# Patient Record
Sex: Male | Born: 1950 | Race: White | Hispanic: No | Marital: Married | Smoking: Former smoker
Health system: Southern US, Community
[De-identification: ages and names within clinical notes are randomized; demographics above are authoritative.]

## PROBLEM LIST (undated history)

## (undated) DIAGNOSIS — J9610 Chronic respiratory failure, unspecified whether with hypoxia or hypercapnia: Secondary | ICD-10-CM

## (undated) HISTORY — PX: CARPAL TUNNEL RELEASE: SHX101

---

## 2015-07-20 ENCOUNTER — Encounter (HOSPITAL_COMMUNITY): Payer: Self-pay | Admitting: Internal Medicine

## 2015-07-20 ENCOUNTER — Observation Stay (HOSPITAL_COMMUNITY)
Admission: AD | Admit: 2015-07-20 | Discharge: 2015-07-21 | Disposition: A | Discharge status: Home / Self Care | Payer: BLUE CROSS/BLUE SHIELD | Source: Other Acute Inpatient Hospital | Attending: Internal Medicine | Admitting: Internal Medicine

## 2015-07-20 DIAGNOSIS — E785 Hyperlipidemia, unspecified: Secondary | ICD-10-CM | POA: Diagnosis not present

## 2015-07-20 DIAGNOSIS — Z6832 Body mass index (BMI) 32.0-32.9, adult: Secondary | ICD-10-CM | POA: Insufficient documentation

## 2015-07-20 DIAGNOSIS — J9610 Chronic respiratory failure, unspecified whether with hypoxia or hypercapnia: Secondary | ICD-10-CM | POA: Insufficient documentation

## 2015-07-20 DIAGNOSIS — I639 Cerebral infarction, unspecified: Secondary | ICD-10-CM | POA: Diagnosis present

## 2015-07-20 DIAGNOSIS — J441 Chronic obstructive pulmonary disease with (acute) exacerbation: Secondary | ICD-10-CM | POA: Diagnosis present

## 2015-07-20 DIAGNOSIS — I6339 Cerebral infarction due to thrombosis of other cerebral artery: Secondary | ICD-10-CM | POA: Diagnosis not present

## 2015-07-20 DIAGNOSIS — Z9981 Dependence on supplemental oxygen: Secondary | ICD-10-CM | POA: Diagnosis not present

## 2015-07-20 DIAGNOSIS — Z7951 Long term (current) use of inhaled steroids: Secondary | ICD-10-CM | POA: Diagnosis not present

## 2015-07-20 DIAGNOSIS — F17200 Nicotine dependence, unspecified, uncomplicated: Secondary | ICD-10-CM | POA: Insufficient documentation

## 2015-07-20 DIAGNOSIS — E669 Obesity, unspecified: Secondary | ICD-10-CM | POA: Diagnosis not present

## 2015-07-20 DIAGNOSIS — R200 Anesthesia of skin: Secondary | ICD-10-CM | POA: Diagnosis present

## 2015-07-20 DIAGNOSIS — I10 Essential (primary) hypertension: Secondary | ICD-10-CM | POA: Diagnosis not present

## 2015-07-20 DIAGNOSIS — R270 Ataxia, unspecified: Secondary | ICD-10-CM | POA: Insufficient documentation

## 2015-07-20 DIAGNOSIS — Z79899 Other long term (current) drug therapy: Secondary | ICD-10-CM | POA: Diagnosis not present

## 2015-07-20 HISTORY — DX: Chronic respiratory failure, unspecified whether with hypoxia or hypercapnia: J96.10

## 2015-07-20 MED ORDER — IPRATROPIUM-ALBUTEROL 0.5-2.5 (3) MG/3ML IN SOLN
3.0000 mL | RESPIRATORY_TRACT | Status: DC
  Administered 2015-07-21: 3 mL via RESPIRATORY_TRACT
  Filled 2015-07-20: qty 3

## 2015-07-20 MED ORDER — SODIUM CHLORIDE 0.9 % IV SOLN
INTRAVENOUS | Status: DC
  Administered 2015-07-21: 02:00:00 via INTRAVENOUS

## 2015-07-20 MED ORDER — BUDESONIDE 0.25 MG/2ML IN SUSP
0.2500 mg | Freq: Two times a day (BID) | RESPIRATORY_TRACT | Status: DC
  Filled 2015-07-20: qty 2

## 2015-07-20 MED ORDER — DOXYCYCLINE HYCLATE 100 MG IV SOLR
100.0000 mg | Freq: Two times a day (BID) | INTRAVENOUS | Status: DC
  Administered 2015-07-21 (×2): 100 mg via INTRAVENOUS
  Filled 2015-07-20 (×3): qty 100

## 2015-07-20 MED ORDER — ALBUTEROL SULFATE (2.5 MG/3ML) 0.083% IN NEBU: 2.5000 mg | INHALATION_SOLUTION | RESPIRATORY_TRACT | Status: DC | PRN

## 2015-07-20 MED ORDER — ENOXAPARIN SODIUM 40 MG/0.4ML ~~LOC~~ SOLN
40.0000 mg | SUBCUTANEOUS | Status: DC
  Administered 2015-07-21: 40 mg via SUBCUTANEOUS
  Filled 2015-07-20: qty 0.4

## 2015-07-20 MED ORDER — ASPIRIN 300 MG RE SUPP: 300.0000 mg | Freq: Every day | RECTAL | Status: DC

## 2015-07-20 MED ORDER — ASPIRIN 325 MG PO TABS
325.0000 mg | ORAL_TABLET | Freq: Every day | ORAL | Status: DC
  Administered 2015-07-21: 325 mg via ORAL
  Filled 2015-07-20: qty 1

## 2015-07-20 MED ORDER — STROKE: EARLY STAGES OF RECOVERY BOOK
Freq: Once | Status: AC
  Administered 2015-07-21: 02:00:00
  Filled 2015-07-20: qty 1

## 2015-07-20 NOTE — Progress Notes (Signed)
Patient arrived to unit alert and oriented x 4. States he is in no pain. Patient recently retired, in good spirits and states he ate at [employer] but is getting hungry again. RN [facility]. RN will complete stroke swollow screen. Patient oriented to unit, call light placed within patient reach. RN will continue to monitor patient and review all medications and orders per MD.

## 2015-07-20 NOTE — H&P (Signed)
History and Physical    Vernell Townley OZH:[id number] DOB: [birth year] DOA: 07/20/2015  PCP: Pcp Not In System  Patient coming from: [facility].  Chief Complaint: Right-sided numbness.  HPI: Leonard Johns is a 65 y.o. male with medical history significant of chronic respiratory failure on home oxygen with COPD and tobacco abuse presented to the ER at [facility] with right upper and lower body numbness and right facial numbness. Patient's symptoms started after waking up in the morning at 6:30 and lasted up to 10:30 AM. Patient also has some difficulty walking. Patient has ataxic but did not fall or lose consciousness. Patient's symptoms completely resolved within 30 a.m. MRI brain done at [facility] shows subtle diffusion hyperintensity in the posterior limb left internal capsule presumably recent infarct. Patient was transferred to [facility] for further workup after discussing with the radiologist. On exam patient doesn't is non-focal and is able to walk without difficulty. Denies any headache visual symptoms difficulty speaking swallowing and is able to move all extremities.   ED Course: MRI brain as discussed in the history of present illness. Labs done in the ER at [facility] shows leukocytosis and EKG was showing normal sinus rhythm. CT head was unremarkable except for right-sided maxillary sinusitis. Chest x-ray was unremarkable.  Review of Systems: As per HPI otherwise 10 point review of systems negative.    Past Medical History  Diagnosis Date  . Chronic respiratory failure [facility])     Past Surgical History  Procedure Laterality Date  . Carpal tunnel release       reports that he has quit smoking. He does not have any smokeless tobacco history on file. He reports that he does not drink alcohol or use illicit drugs.  No Known Allergies  Family History  Problem Relation Age of Onset  . CAD Mother   . Stroke Brother     Prior to  Admission medications   Not on File    Physical Exam: Filed Vitals:   07/20/15 2111  BP: 147/79  Pulse: 64  Temp: 98.3 F (36.8 C)  TempSrc: Oral  Resp: 18  Height:  (1.575 m)  Weight: 177 lb 11.2 oz (80.604 kg)  SpO2: 95%      Constitutional: Not in distress. Filed Vitals:   07/20/15 2111  BP: 147/79  Pulse: 64  Temp: 98.3 F (36.8 C)  TempSrc: Oral  Resp: 18  Height:  (1.575 m)  Weight: 177 lb 11.2 oz (80.604 kg)  SpO2: 95%   Eyes: Anicteric no pallor. ENMT: No discharge from the ears eyes nose or mouth. Neck: No mass felt. No neck rigidity. Respiratory: Bilateral expiratory wheeze heard no crepitations. Cardiovascular: S1 and S2 heard. Abdomen: Soft nontender bowel sounds present. Musculoskeletal: No edema. Skin: No rash. Neurologic: Alert awake oriented to time place and person. Moves all extremities 5 x 5. No facial asymmetry. Tongue is midline. PERRLA positive. Psychiatric: Appears normal.   Labs on Admission: I have personally reviewed following labs and imaging studies  CBC: No results for input(s): WBC, NEUTROABS, HGB, HCT, MCV, PLT in the last 168 hours. Basic Metabolic Panel: No results for input(s): NA, K, CL, CO2, GLUCOSE, BUN, CREATININE, CALCIUM, MG, PHOS in the last 168 hours. GFR: CrCl cannot be calculated (Patient has no serum creatinine result on file.). Liver Function Tests: No results for input(s): AST, ALT, ALKPHOS, BILITOT, PROT, ALBUMIN in the last 168 hours. No results for input(s): LIPASE, AMYLASE in the last  168 hours. No results for input(s): AMMONIA in the last 168 hours. Coagulation Profile: No results for input(s): INR, PROTIME in the last 168 hours. Cardiac Enzymes: No results for input(s): CKTOTAL, CKMB, CKMBINDEX, TROPONINI in the last 168 hours. BNP (last 3 results) No results for input(s): PROBNP in the last 8760 hours. HbA1C: No results for input(s): HGBA1C in the last 72 hours. CBG: No results for  input(s): GLUCAP in the last 168 hours. Lipid Profile: No results for input(s): CHOL, HDL, LDLCALC, TRIG, CHOLHDL, LDLDIRECT in the last 72 hours. Thyroid Function Tests: No results for input(s): TSH, T4TOTAL, FREET4, T3FREE, THYROIDAB in the last 72 hours. Anemia Panel: No results for input(s): VITAMINB12, FOLATE, FERRITIN, TIBC, IRON, RETICCTPCT in the last 72 hours. Urine analysis: No results found for: COLORURINE, APPEARANCEUR, LABSPEC, PHURINE, GLUCOSEU, HGBUR, BILIRUBINUR, KETONESUR, PROTEINUR, UROBILINOGEN, NITRITE, LEUKOCYTESUR Sepsis Labs: @LABRCNTIP (procalcitonin:4,lacticidven:4) )No results found for this or any previous visit (from the past 240 hour(s)).   Radiological Exams on Admission: No results found.  EKG: Independently reviewed. Normal sinus rhythm. [facility].  Assessment/Plan Principal Problem:   Stroke, acute, thrombotic (HCC) Active Problems:   COPD exacerbation (HCC)   Stroke (cerebrum) (HCC)    #1. Stroke - MRI was showing features concerning in the left internal capsule. I have discussed with on-call neurologist Dr. Zigmund DanielShikman who will be seeing patient in consult. MRA brain 2-D echo carotid Doppler has been ordered. Closely monitor in telemetry for any arrhythmias. Physical therapy consult. Swallow evaluation and neuro checks. Check hemoglobin A1c and lipid panel. Aspirin. #2. COPD exacerbation - patient has bilateral expiratory wheeze with cough and CT scan also showing sinusitis. I have placed patient on doxycycline with nebulizer and Pulmicort. If patient continues to wheeze may need steroids. #3. Tobacco abuse - patient states he quit last week.   DVT prophylaxis: Lovenox. Code Status: Full code.  Family Communication: Wife and daughter.  Disposition Plan: Home.  Consults called: Neurology.  Admission status: Observation. Telemetry.    Eduard ClosKAKRAKANDY,ARSHAD N. MD [facility] Pager [phone].  If 7PM-7AM, please contact  night-coverage [email or web address] Password [email or web address]  07/20/2015, 11:15 PM

## 2015-07-21 ENCOUNTER — Observation Stay (HOSPITAL_BASED_OUTPATIENT_CLINIC_OR_DEPARTMENT_OTHER): Payer: BLUE CROSS/BLUE SHIELD

## 2015-07-21 ENCOUNTER — Observation Stay (HOSPITAL_COMMUNITY): Payer: BLUE CROSS/BLUE SHIELD

## 2015-07-21 DIAGNOSIS — I63312 Cerebral infarction due to thrombosis of left middle cerebral artery: Secondary | ICD-10-CM

## 2015-07-21 DIAGNOSIS — I639 Cerebral infarction, unspecified: Secondary | ICD-10-CM | POA: Diagnosis not present

## 2015-07-21 DIAGNOSIS — I6330 Cerebral infarction due to thrombosis of unspecified cerebral artery: Secondary | ICD-10-CM | POA: Diagnosis not present

## 2015-07-21 DIAGNOSIS — E785 Hyperlipidemia, unspecified: Secondary | ICD-10-CM

## 2015-07-21 DIAGNOSIS — I6789 Other cerebrovascular disease: Secondary | ICD-10-CM | POA: Diagnosis not present

## 2015-07-21 DIAGNOSIS — J441 Chronic obstructive pulmonary disease with (acute) exacerbation: Secondary | ICD-10-CM | POA: Diagnosis not present

## 2015-07-21 DIAGNOSIS — I6339 Cerebral infarction due to thrombosis of other cerebral artery: Secondary | ICD-10-CM | POA: Diagnosis not present

## 2015-07-21 LAB — COMPREHENSIVE METABOLIC PANEL
ALBUMIN: 3.3 g/dL — AB (ref 3.5–5.0)
ALK PHOS: 69 U/L (ref 38–126)
ALT: 11 U/L — ABNORMAL LOW (ref 17–63)
ANION GAP: 10 (ref 5–15)
AST: 21 U/L (ref 15–41)
BUN: 19 mg/dL (ref 6–20)
CALCIUM: 9 mg/dL (ref 8.9–10.3)
CO2: 24 mmol/L (ref 22–32)
CREATININE: 0.98 mg/dL (ref 0.61–1.24)
Chloride: 106 mmol/L (ref 101–111)
GFR calc non Af Amer: >60 mL/min (ref >60)
Glucose, Bld: 162 mg/dL — ABNORMAL HIGH (ref 65–99)
POTASSIUM: 3.9 mmol/L (ref 3.5–5.1)
SODIUM: 140 mmol/L (ref 135–145)
Total Bilirubin: 0.3 mg/dL (ref 0.3–1.2)
Total Protein: 6.4 g/dL — ABNORMAL LOW (ref 6.5–8.1)

## 2015-07-21 LAB — LIPID PANEL
CHOL/HDL RATIO: 4.7 ratio
CHOLESTEROL: 174 mg/dL (ref 0–200)
HDL: 37 mg/dL — AB (ref >40)
LDL Cholesterol: 124 mg/dL — ABNORMAL HIGH (ref 0–99)
TRIGLYCERIDES: 66 mg/dL (ref <150)
VLDL: 13 mg/dL (ref 0–40)

## 2015-07-21 LAB — CBC
HCT: 43.2 % (ref 39.0–52.0)
Hemoglobin: 14.2 g/dL (ref 13.0–17.0)
MCH: 29.2 pg (ref 26.0–34.0)
MCHC: 32.9 g/dL (ref 30.0–36.0)
MCV: 88.7 fL (ref 78.0–100.0)
PLATELETS: 277 10*3/uL (ref 150–400)
RBC: 4.87 MIL/uL (ref 4.22–5.81)
RDW: 13.7 % (ref 11.5–15.5)
WBC: 10.6 10*3/uL — ABNORMAL HIGH (ref 4.0–10.5)

## 2015-07-21 LAB — ECHOCARDIOGRAM COMPLETE
HEIGHTINCHES: 62 in
WEIGHTICAEL: 2843.2 [oz_av]

## 2015-07-21 LAB — URINALYSIS, ROUTINE W REFLEX MICROSCOPIC
GLUCOSE, UA: NEGATIVE mg/dL
Hgb urine dipstick: NEGATIVE
Ketones, ur: 15 mg/dL — AB
LEUKOCYTES UA: NEGATIVE
NITRITE: NEGATIVE
PROTEIN: NEGATIVE mg/dL
Specific Gravity, Urine: 1.034 — ABNORMAL HIGH (ref 1.005–1.030)
pH: 5.5 (ref 5.0–8.0)

## 2015-07-21 LAB — RAPID URINE DRUG SCREEN, HOSP PERFORMED
Amphetamines: NOT DETECTED
BARBITURATES: NOT DETECTED
Benzodiazepines: NOT DETECTED
COCAINE: NOT DETECTED
Opiates: NOT DETECTED
Tetrahydrocannabinol: NOT DETECTED

## 2015-07-21 MED ORDER — ASPIRIN 81 MG PO TBEC: 81.0000 mg | DELAYED_RELEASE_TABLET | Freq: Every day | ORAL | Status: AC

## 2015-07-21 MED ORDER — BUPROPION HCL ER (SR) 150 MG PO TB12: ORAL_TABLET | ORAL | Status: AC

## 2015-07-21 MED ORDER — PREDNISONE 10 MG (21) PO TBPK: 10.0000 mg | ORAL_TABLET | Freq: Every day | ORAL | Status: AC

## 2015-07-21 MED ORDER — ATORVASTATIN CALCIUM 40 MG PO TABS
40.0000 mg | ORAL_TABLET | Freq: Every day | ORAL | Status: DC
  Filled 2015-07-21: qty 1

## 2015-07-21 MED ORDER — DOXYCYCLINE HYCLATE 50 MG PO CAPS: 50.0000 mg | ORAL_CAPSULE | Freq: Two times a day (BID) | ORAL | Status: AC

## 2015-07-21 MED ORDER — ASPIRIN EC 81 MG PO TBEC
81.0000 mg | DELAYED_RELEASE_TABLET | Freq: Every day | ORAL | Status: DC
  Filled 2015-07-21: qty 1

## 2015-07-21 MED ORDER — ATORVASTATIN CALCIUM 40 MG PO TABS: 40.0000 mg | ORAL_TABLET | Freq: Every day | ORAL | Status: AC

## 2015-07-21 MED ORDER — IPRATROPIUM-ALBUTEROL 0.5-2.5 (3) MG/3ML IN SOLN
3.0000 mL | Freq: Three times a day (TID) | RESPIRATORY_TRACT | Status: DC
  Filled 2015-07-21: qty 3

## 2015-07-21 NOTE — Progress Notes (Signed)
*  PRELIMINARY RESULTS* Vascular Ultrasound Carotid Duplex (Doppler) has been completed. There is no obvious evidence of hemodynamically significant internal carotid artery stenosis bilaterally. Vertebral arteries are patent with antegrade flow.  07/21/2015 2:56 PM Gertie FeyMichelle Nickolette Espinola, RVT, RDCS, RDMS

## 2015-07-21 NOTE — Progress Notes (Signed)
Pt discharge education and instructions completed with pt and family at bedside; all voices understanding denies any questions. Pt IV and telemetry removed; pt discharge home with family to transport him home. Pt to pick up electronically sent prescription from preferred pharmacy on file. Pt offered wheelchair but he declined and ambulated off unit with belongings and family at side. Dionne BucyP. Amo Shalynn Jorstad RN

## 2015-07-21 NOTE — Progress Notes (Addendum)
STROKE TEAM PROGRESS NOTE   HISTORY OF PRESENT ILLNESS Leonard Johns is an 65 y.o. male with hx of COPD and smoking [facility] with right upper and lower body numbness and right facial numbness. Patient's symptoms started after waking up in the morning lasted 4 hours till 10:30 AM. Patient's symptoms completely resolved. [facility] shows diffusion hyperintensity in the posterior limb left internal capsule presumably acute vs subacute infarct. Patient was [facility]. Patient was not administered IV t-PA.    SUBJECTIVE (INTERVAL HISTORY) His wife is at the bedside.  Overall he feels his condition is completely resolved. He was retired not long ago, decreased physical activity levels and more eating at home. Still smoking. Stated that his annual check with PCP always good at cholesterol.    OBJECTIVE Temp:  [98.3 F (36.8 C)-98.4 F (36.9 C)] 98.4 F (36.9 C) (05/19 0000) Pulse Rate:  [64-79] 73 (05/19 0707) Cardiac Rhythm:  [-] Normal sinus rhythm (05/19 0700) Resp:  [16-20] 20 (05/19 0707) BP: (108-147)/(45-79) 142/70 mmHg (05/19 0707) SpO2:  [92 %-95 %] 92 % (05/19 0707) Weight:  [80.604 kg (177 lb 11.2 oz)] 80.604 kg (177 lb 11.2 oz) (05/18 2111)  CBC:  Recent Labs Lab 07/21/15 0008  WBC 10.6*  HGB 14.2  HCT 43.2  MCV 88.7  PLT 277    Basic Metabolic Panel:  Recent Labs Lab 07/21/15 0008  NA 140  K 3.9  CL 106  CO2 24  GLUCOSE 162*  BUN 19  CREATININE 0.98  CALCIUM 9.0    Lipid Panel:    Component Value Date/Time   CHOL 174 07/21/2015 0008   TRIG 66 07/21/2015 0008   HDL 37* 07/21/2015 0008   CHOLHDL 4.7 07/21/2015 0008   VLDL 13 07/21/2015 0008   LDLCALC 124* 07/21/2015 0008   HgbA1c: No results found for: HGBA1C Urine Drug Screen:    Component Value Date/Time   LABOPIA NONE DETECTED 07/21/2015 0500   COCAINSCRNUR NONE DETECTED 07/21/2015 0500   LABBENZ  NONE DETECTED 07/21/2015 0500   AMPHETMU NONE DETECTED 07/21/2015 0500   THCU NONE DETECTED 07/21/2015 0500   LABBARB NONE DETECTED 07/21/2015 0500      IMAGING I have personally reviewed the radiological images below and agree with the radiology interpretations.  MRI brain (@ [facility]) - no imaging available. 07/30/2015 diffusion hyperintensity in the posterior limb left internal capsule, acute vs subacute infarct  Mr Maxine GlennMra Head/brain Wo Cm 07/21/2015   Normal intracranial MRA. No large or proximal arterial branch occlusion. No high-grade or correctable stenosis. Widely patent vertebrobasilar system with minimal atheromatous disease for patient age.   CUS - There is no obvious evidence of hemodynamically significant internal carotid artery stenosis bilaterally. Vertebral arteries are patent with antegrade flow.  2D echo -  - Left ventricle: The cavity size was normal. Wall thickness was  normal. Systolic function was normal. The estimated ejection  fraction was in the range of 55% to 60%. Wall motion was normal;  there were no regional wall motion abnormalities. Doppler  parameters are consistent with abnormal left ventricular  relaxation (grade 1 diastolic dysfunction). - Left atrium: The atrium was mildly dilated. Impressions: - Normal LV systolic function; grade 1 diastolic dysfunction; mild  LAE.  PHYSICAL EXAM Physical exam  Temp:  [98.3 F (36.8 C)-98.4 F (36.9 C)] 98.4 F (36.9 C) (05/19 0000) Pulse Rate:  [64-79] 67 (05/19 0920) Resp:  [  16-20] 20 (05/19 0920) BP: (108-147)/(45-79) 135/67 mmHg (05/19 0920) SpO2:  [92 %-95 %] 95 % (05/19 0920) Weight:  [177 lb 11.2 oz (80.604 kg)] 177 lb 11.2 oz (80.604 kg) (05/18 2111)  General - Well nourished, well developed, in no apparent distress.  Ophthalmologic - Sharp disc margins OU.   Cardiovascular - Regular rate and rhythm with no murmur.  Mental Status -  Level of arousal and orientation to time, place, and  person were intact. Language including expression, naming, repetition, comprehension was assessed and found intact. Fund of Knowledge was assessed and was intact.  Cranial Nerves II - XII - II - Visual field intact OU. III, IV, VI - Extraocular movements intact. V - Facial sensation intact bilaterally. VII - Facial movement intact bilaterally. VIII - Hearing & vestibular intact bilaterally. X - Palate elevates symmetrically. XI - Chin turning & shoulder shrug intact bilaterally. XII - Tongue protrusion intact.  Motor Strength - The patient's strength was normal in all extremities and pronator drift was absent.  Bulk was normal and fasciculations were absent.   Motor Tone - Muscle tone was assessed at the neck and appendages and was normal.  Reflexes - The patient's reflexes were 1+ in all extremities and he had no pathological reflexes.  Sensory - Light touch, temperature/pinprick were assessed and were symmetrical.    Coordination - The patient had normal movements in the hands and feet with no ataxia or dysmetria.  Tremor was absent.  Gait and Station - The patient's transfers, posture, gait, station, and turns were observed as normal.   ASSESSMENT/PLAN Mr. Leonard Johns is a 65 y.o. male with history of COPD and smoking who presented to [facility] with transient R sided face/arm/leg numbness. Transferred to [facility] for further workup. He did not receive IV t-PA.   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 OK with ASA  daily on discharge.  Patient counseled to be compliant with his antithrombotic medications  Ongoing aggressive stroke  risk factor management  Therapy recommendations:  No PT  Disposition:  Return home  Hypertension  Stable  Permissive hypertension (OK if < 220/120) but gradually normalize in 5-7 days  Hyperlipidemia  Home meds:  No statin  LDL 124, goal < 70  Add lipitor   Continue statin at discharge  Tobacco abuse  Current smoker  Smoking cessation counseling provided  Pt is willing to quit  Other Stroke Risk Factors  Obesity, Body mass index is 32.49 kg/(m^2).   Family hx stroke (brother)  Other Active Problems  COPD exacerbation  Hospital day # 1  Neurology will sign off. Please call with questions. Pt will follow up with Darrol Angel NP at [facility] in about 2 months. Thanks for the consult.  Marvel Plan, MD PhD Stroke Neurology 07/21/2015 12:48 PM    To contact Stroke Continuity provider, please refer to [email or web address]. After hours, contact General Neurology

## 2015-07-21 NOTE — Evaluation (Signed)
Physical Therapy Evaluation and Discharge  Patient Details Name: Leonard Johns MRN: [id number] DOB: May 19, 1950 Today's Date: 07/21/2015   History of Present Illness  65 y.o. male with hx of COPD and smoking presented to the ER at [facility] with right upper and lower body numbness and right facial numbness. Patient's symptoms started after waking up in the morning lasted 4 hours till 10:30 AM. Patient's symptoms completely resolved. MRI brain done at [facility] shows diffusion hyperintensity in the posterior limb left internal capsule presumably acute vs subacute infarct    Clinical Impression  Patient evaluated by Physical Therapy with no further acute PT needs identified. Patient had Lt knee injury ~4 months ago (could only say he heard a "pop"; tests negative for ligament injury; used crutches (now intermittently uses cane); and had OPPT. He continues to have mild gait disturbance due to Lt knee pain).  PT is signing off. Thank you for this referral.     Follow Up Recommendations No PT follow up    Equipment Recommendations  None recommended by PT    Recommendations for Other Services       Precautions / Restrictions Precautions Precautions: Fall Precaution Comments: fall risk due to Lt knee injury ~4 mos ago      Mobility  Bed Mobility Overal bed mobility: Modified Independent                Transfers Overall transfer level: Independent Equipment used: None                Ambulation/Gait Ambulation/Gait assistance: Min Control and instrumentation engineer (Feet): 300 Feet Assistive device: Straight cane;None Gait Pattern/deviations: Step-through pattern;Antalgic;Decreased stride length Gait velocity: able to incr and decr, however overall decr Gait velocity interpretation: Below normal speed for age/gender General Gait Details: without device pt with partial buckle of Lt knee due to sudden pain; pt independently caught himself and  pain did not occur again; worked with cane and pt required instruction re: proper use and recommended he use it for now;  Stairs Stairs: Yes Stairs assistance: Modified independent (Device/Increase time) Stair Management: One rail Right;Alternating pattern;Forwards Number of Stairs: 5 General stair comments: no difficulties (including Lt knee)  Wheelchair Mobility    Modified Rankin (Stroke Patients Only) Modified Rankin (Stroke Patients Only) Pre-Morbid Rankin Score: No symptoms Modified Rankin: No symptoms (due to CVA; mild impairment due to recent knee injury)     Balance Overall balance assessment: Independent (denies falling since knee injury)                               Standardized Balance Assessment Standardized Balance Assessment : Dynamic Gait Index;Berg Balance Test Berg Balance Test Sit to Stand: Able to stand without using hands and stabilize independently Standing Unsupported: Able to stand safely 2 minutes Sitting with Back Unsupported but Feet Supported on Floor or Stool: Able to sit safely and securely 2 minutes Stand to Sit: Sits safely with minimal use of hands Transfers: Able to transfer safely, minor use of hands Standing Unsupported with Eyes Closed: Able to stand 10 seconds safely Standing Ubsupported with Feet Together: Able to place feet together independently and stand 1 minute safely Standing Unsupported, Alternately Place Feet on Step/Stool: Able to stand independently and safely and complete 8 steps in 20 seconds Standing Unsupported, One Foot in Front: Able to place foot tandem independently and hold 30 seconds Standing on One Leg: Able to lift leg independently  and hold > 10 seconds Dynamic Gait Index Level Surface: Mild Impairment Change in Gait Speed: Mild Impairment Gait with Horizontal Head Turns: Mild Impairment Gait with Vertical Head Turns: Mild Impairment Gait and Pivot Turn: Normal Step Over Obstacle: Mild  Impairment Step Around Obstacles: Normal Steps: Normal Total Score: 19       Pertinent Vitals/Pain Pain Assessment: No/denies pain    Home Living Family/patient expects to be discharged to:: Private residence Living Arrangements: Spouse/significant other Available Help at Discharge: Family;Available PRN/intermittently Type of Home: House Home Access: Stairs to enter Entrance Stairs-Rails: None Entrance Stairs-Number of Steps: 1 Home Layout: One level Home Equipment: Crutches;Cane - single point      Prior Function Level of Independence: Independent with assistive device(s)         Comments: after knee "popped" he was put on crutches, went to OPPT, progressed to cane which he now rarely uses (mostly if back hurting)     Hand Dominance   Dominant Hand: Left    Extremity/Trunk Assessment   Upper Extremity Assessment: Defer to OT evaluation           Lower Extremity Assessment: Overall WFL for tasks assessed (sensation, proprioception, coordination, strength intact)      Cervical / Trunk Assessment: Normal  Communication   Communication: No difficulties  Cognition Arousal/Alertness: Awake/alert Behavior During Therapy: WFL for tasks assessed/performed Overall Cognitive Status: Within Functional Limits for tasks assessed                      General Comments General comments (skin integrity, edema, etc.): DGI score decreased due to antalgic Lt knee and gait changes    Exercises        Assessment/Plan    PT Assessment Patent does not need any further PT services  PT Diagnosis Hemiplegia non-dominant side   PT Problem List    PT Treatment Interventions     PT Goals (Current goals can be found in the Care Plan section) Acute Rehab PT Goals Patient Stated Goal: go home today PT Goal Formulation: All assessment and education complete, DC therapy    Frequency     Barriers to discharge        Co-evaluation               End of  Session Equipment Utilized During Treatment: Gait belt Activity Tolerance: Patient tolerated treatment well Patient left: in bed;with call bell/phone within reach;with family/visitor present Nurse Communication: Mobility status (OK to be up alone with cane;)    Functional Assessment Tool Used: clinical judgement Functional Limitation: Mobility: Walking and moving around Mobility: Walking and Moving Around Current Status (Z6109): At least 1 percent but less than 20 percent impaired, limited or restricted Mobility: Walking and Moving Around Goal Status [phone]): At least 1 percent but less than 20 percent impaired, limited or restricted Mobility: Walking and Moving Around Discharge Status [phone]): At least 1 percent but less than 20 percent impaired, limited or restricted    Time: 0852-0925 PT Time Calculation (min) (ACUTE ONLY): 33 min   Charges:   PT Evaluation $PT Eval Low Complexity: 1 Procedure PT Treatments $Therapeutic Activity: 8-22 mins   PT G Codes:   PT G-Codes **NOT FOR INPATIENT CLASS** Functional Assessment Tool Used: clinical judgement Functional Limitation: Mobility: Walking and moving around Mobility: Walking and Moving Around Current Status (B1478): At least 1 percent but less than 20 percent impaired, limited or restricted Mobility: Walking and Moving Around Goal Status [phone]): At  least 1 percent but less than 20 percent impaired, limited or restricted Mobility: Walking and Moving Around Discharge Status [phone](G8980): At least 1 percent but less than 20 percent impaired, limited or restricted    Cabela Pacifico 07/21/2015, 9:46 AM Pager [phone]

## 2015-07-21 NOTE — Progress Notes (Signed)
OT Cancellation Note  Patient Details Name: Leonard Johns MRN: [id number] DOB: 10-17-1950   Cancelled Treatment:    Reason Eval/Treat Not Completed: OT screened, no needs identified, will sign off. Pt reports all symptoms have resolved; no visual or UE deficits noted. Per PT; pt at baseline with mobility. Educated pt and family on signs and symptoms of stroke. No further acute OT needs identified; signing off at this time. Please re-consult if needs change. Thank you for this referral.  Gaye AlkenBailey A Neela Zecca M.S., OTR/L Pager: [phone]  07/21/2015, 10:42 AM

## 2015-07-21 NOTE — Discharge Summary (Signed)
Physician Discharge Summary  Leonard Johns ZOX:[id number] DOB: [birth year] DOA: 07/20/2015  PCP: Pcp Not In System  Admit date: 07/20/2015 Discharge date: 07/21/2015   Recommendations for Outpatient Follow-Up:   1. Smoking cessation 2. HgbA1C   Discharge Diagnosis:   Principal Problem:   Stroke, acute, thrombotic (HCC) Active Problems:   COPD exacerbation (HCC)   Stroke (cerebrum) [facility])   Discharge disposition:  Home  Discharge Condition: Improved.  Diet recommendation: Low sodium, heart healthy  Wound care: None.   History of Present Illness:   Leonard Johns is a 65 y.o. male with medical history significant of chronic respiratory failure on home oxygen with COPD and tobacco abuse presented to the ER at [facility] with right upper and lower body numbness and right facial numbness. Patient's symptoms started after waking up in the morning at 6:30 and lasted up to 10:30 AM. Patient also has some difficulty walking. Patient has ataxic but did not fall or lose consciousness. Patient's symptoms completely resolved within 30 a.m. MRI brain done at [facility] shows subtle diffusion hyperintensity in the posterior limb left internal capsule presumably recent infarct. Patient was transferred to [facility] for further workup after discussing with the radiologist. On exam patient doesn't is non-focal and is able to walk without difficulty. Denies any headache visual symptoms difficulty speaking swallowing and is able to move all extremities.    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  Patient counseled to be compliant with his antithrombotic medications  Ongoing  aggressive stroke risk factor management  Therapy recommendations: No PT  Disposition: Return home 5/19  Hypertension  Stable  Permissive hypertension (OK if < 220/120) but gradually normalize in 5-7 days  Hyperlipidemia  Home meds: No statin  LDL 124, goal < 70  Add lipitor   Continue statin at discharge  FLP/LFTs 6 weeks    COPD exacerbation - abx and steroid taper  Tobacco abuse - wellbutrin sent to pharmacy- only medication that has helped him quit before- defer to PCP for outpatient management    Medical Consultants:    None.   Discharge Exam:   Filed Vitals:   07/21/15 0707 07/21/15 0920  BP: 142/70 135/67  Pulse: 73 67  Temp:    Resp: 20 20   Filed Vitals:   07/21/15 0104 07/21/15 0414 07/21/15 0707 07/21/15 0920  BP: 124/64 136/74 142/70 135/67  Pulse: 74 68 73 67  Temp:      TempSrc:      Resp: Height:      Weight:      SpO2: 94% 94% 92% 95%    Gen:  NAD Cardiovascular:  RRR, No M/R/G Respiratory: Lungs CTAB Gastrointestinal: Abdomen soft, NT/ND with normal active bowel sounds. Extremities: No C/E/C   The results of significant diagnostics from this hospitalization (including imaging, microbiology, ancillary and laboratory) are listed below for reference.     Procedures and Diagnostic Studies:   Mr [name] Head/brain Wo Cm  07/21/2015  CLINICAL DATA:  Initial evaluation for acute dizziness. EXAM: MRA HEAD WITHOUT CONTRAST TECHNIQUE: Angiographic images of the Circle of Willis were obtained using MRA technique without intravenous contrast. COMPARISON:  None.  FINDINGS: ANTERIOR CIRCULATION: Visualized distal cervical segments of the internal carotid arteries are widely patent with antegrade flow. Petrous, cavernous, and supraclinoid segments are widely patent bilaterally without flow-limiting stenosis. ICA termini widely patent. A1 segments patent bilaterally. Anterior communicating artery normal. Anterior cerebral  arteries well opacified to their distal aspects and normal in appearance. M1 segments widely patent without stenosis or occlusion. MCA bifurcations normal. Probable short-segment atheromatous stenosis within a proximal M2 branch noted on the left (series 303, image 12). Distal MCA branches well opacified and symmetric. Minimal distal small vessel atheromatous irregularity noted within the MCA branches bilaterally. POSTERIOR CIRCULATION: Vertebral arteries widely patent to the vertebrobasilar junction. Vertebral arteries are largely code dominant. Partially visualized posterior inferior cerebral arteries patent bilaterally. Basilar artery widely patent. Superior cerebellar arteries well opacified bilaterally. Probable short-segment stenosis within the proximal left SCA. Both of the posterior cerebral arteries arise from the basilar artery are well opacified to their distal aspects. Possible tiny short-segment mild stenosis within the proximal left P2 segment. No aneurysm or vascular malformation. IMPRESSION: Normal intracranial MRA. No large or proximal arterial branch occlusion. No high-grade or correctable stenosis. Widely patent vertebrobasilar system with minimal atheromatous disease for patient age. Electronically Signed   By: Rise Mu M.D.   On: 07/21/2015 03:23     Labs:   Basic Metabolic Panel:  Recent Labs Lab 07/21/15 0008  NA 140  K 3.9  CL 106  CO2 24  GLUCOSE 162*  BUN 19  CREATININE 0.98  CALCIUM 9.0   GFR Estimated Creatinine Clearance: 71.9 mL/min (by C-G formula based on Cr of 0.98). Liver Function Tests:  Recent Labs Lab 07/21/15 0008  AST 21  ALT 11*  ALKPHOS 69  BILITOT 0.3  PROT 6.4*  ALBUMIN 3.3*   No results for input(s): LIPASE, AMYLASE in the last 168 hours. No results for input(s): AMMONIA in the last 168 hours. Coagulation profile No results for input(s): INR, PROTIME in the last 168 hours.  CBC:  Recent Labs Lab 07/21/15 0008  WBC  10.6*  HGB 14.2  HCT 43.2  MCV 88.7  PLT 277   Cardiac Enzymes: No results for input(s): CKTOTAL, CKMB, CKMBINDEX, TROPONINI in the last 168 hours. BNP: Invalid input(s): POCBNP CBG: No results for input(s): GLUCAP in the last 168 hours. D-Dimer No results for input(s): DDIMER in the last 72 hours. Hgb A1c No results for input(s): HGBA1C in the last 72 hours. Lipid Profile  Recent Labs  07/21/15 0008  CHOL 174  HDL 37*  LDLCALC 124*  TRIG 66  CHOLHDL 4.7   Thyroid function studies No results for input(s): TSH, T4TOTAL, T3FREE, THYROIDAB in the last 72 hours.  Invalid input(s): FREET3 Anemia work up No results for input(s): VITAMINB12, FOLATE, FERRITIN, TIBC, IRON, RETICCTPCT in the last 72 hours. Microbiology No results found for this or any previous visit (from the past 240 hour(s)).   Discharge Instructions:      Medication List    TAKE these medications        amLODipine 5 MG tablet  Commonly known as:  NORVASC  Take 5 mg by mouth daily.     aspirin 81 MG EC tablet  Take 1 tablet (81 mg total) by mouth daily.     atorvastatin 40 MG tablet  Commonly known as:  LIPITOR  Take 1 tablet (40 mg total) by mouth daily at 6 PM.     BREO ELLIPTA 100-25 MCG/INH Aepb  Generic drug:  fluticasone furoate-vilanterol  Inhale  1 puff into the lungs daily.     buPROPion 150 MG 12 hr tablet  Commonly known as:  WELLBUTRIN SR  150 mg PO daily x 3 days then 2x/day     doxycycline 50 MG capsule  Commonly known as:  VIBRAMYCIN  Take 1 capsule (50 mg total) by mouth 2 (two) times daily.     predniSONE 10 MG (21) Tbpk tablet  Commonly known as:  STERAPRED UNI-PAK 21 TAB  Take 1 tablet (10 mg total) by mouth daily.     PROAIR HFA 108 (90 Base) MCG/ACT inhaler  Generic drug:  albuterol  Inhale 1 puff into the lungs every 6 (six) hours as needed. wheezing          Time coordinating discharge: 35 min  Signed:  Covey Baller U Liel Rudden   [facility] 07/21/2015,  11:54 AM

## 2015-07-21 NOTE — Consult Note (Signed)
NEURO HOSPITALIST CONSULT NOTE      Reason for Consult: acute vs subacute stroke on MRI   History obtained from:  Patient     HPI:                                                                                                                                          Leonard Johns is an 65 y.o. male with hx of COPD and smoking presented [facility] ER [facility] with right upper and lower body numbness and right facial numbness. Patient's symptoms started after waking up in the morning lasted 4 hours till 10:30 AM.  Patient's symptoms completely resolved. [facility] brain done [facility] shows  diffusion hyperintensity in the posterior limb left internal capsule presumably acute vs subacute infarct. [facility]   Past Medical History  Diagnosis Date  . Chronic respiratory failure [facility](HCC)     Past Surgical History  Procedure Laterality Date  . Carpal tunnel release      Family History  Problem Relation Age of Onset  . CAD Mother   . Stroke Brother      Social History:  reports that he has quit smoking. He does not have any smokeless tobacco history on file. He reports that he does not drink alcohol or use illicit drugs.  No Known Allergies  MEDICATIONS:                                                                                                                     I have reviewed the patient's current medications.   ROS:  History obtained from chart review and the patient  General ROS: negative for - chills, fatigue, fever, night sweats, weight gain or weight loss Psychological ROS: negative for - behavioral disorder, hallucinations, memory difficulties, mood swings or suicidal ideation Ophthalmic ROS: negative for - blurry vision, double vision, eye pain  or loss of vision ENT ROS: negative for - epistaxis, nasal discharge, oral lesions, sore throat, tinnitus or vertigo Allergy and Immunology ROS: negative for - hives or itchy/watery eyes Hematological and Lymphatic ROS: negative for - bleeding problems, bruising or swollen lymph nodes Endocrine ROS: negative for - galactorrhea, hair pattern changes, polydipsia/polyuria or temperature intolerance Respiratory ROS: negative for - cough, hemoptysis, shortness of breath or wheezing Cardiovascular ROS: negative for - chest pain, dyspnea on exertion, edema or irregular heartbeat Gastrointestinal ROS: negative for - abdominal pain, diarrhea, hematemesis, nausea/vomiting or stool incontinence Genito-Urinary ROS: negative for - dysuria, hematuria, incontinence or urinary frequency/urgency Musculoskeletal ROS: negative for - joint swelling or muscular weakness Neurological ROS: as noted in HPI Dermatological ROS: negative for rash and skin lesion changes   Blood pressure 136/74, pulse 68, temperature 98.4 F (36.9 C), temperature source Oral, resp. rate 16, height 5\' 2"  (1.575 m), weight 80.604 kg (177 lb 11.2 oz), SpO2 94 %.   Neurologic Examination:                                                                                                      HEENT-  Normocephalic, no lesions, without obvious abnormality.  Normal external eye and conjunctiva.  Normal TM's bilaterally.  Normal auditory canals and external ears. Normal external nose, mucus membranes and septum.  Normal pharynx. Cardiovascular- regular rate and rhythm, S1, S2 normal, no murmur, click, rub or gallop, pulses palpable throughout   Lungs- chest clear, no wheezing, rales, normal symmetric air entry, Heart exam - S1, S2 normal, no murmur, no gallop, rate regular Abdomen- soft, non-tender; bowel sounds normal; no masses,  no organomegaly   Neurological Examination Mental Status: Alert, oriented, thought content appropriate.  Speech  fluent without evidence of aphasia.  Able to follow 3 step commands without difficulty. Cranial Nerves: II:  Visual fields grossly normal, pupils equal, round, reactive to light and accommodation III,IV, VI: ptosis not present, extra-ocular motions intact bilaterally V,VII: smile symmetric, facial light touch sensation normal bilaterally VIII: hearing normal bilaterally IX,X: uvula rises symmetrically XI: bilateral shoulder shrug XII: midline tongue extension Motor: Right : Upper extremity   5/5    Left:     Upper extremity   5/5  Lower extremity   5/5     Lower extremity   5/5 Tone and bulk:normal tone throughout; no atrophy noted Sensory: Pinprick and light touch intact throughout, bilaterally Cerebellar: normal finger-to-nose intact b/l      Lab Results: Basic Metabolic Panel:  Recent Labs Lab 07/21/15 0008  NA 140  K 3.9  CL 106  CO2 24  GLUCOSE 162*  BUN 19  CREATININE 0.98  CALCIUM 9.0    Liver Function Tests:  Recent Labs Lab 07/21/15 0008  AST 21  ALT  11*  ALKPHOS 69  BILITOT 0.3  PROT 6.4*  ALBUMIN 3.3*   No results for input(s): LIPASE, AMYLASE in the last 168 hours. No results for input(s): AMMONIA in the last 168 hours.  CBC:  Recent Labs Lab 07/21/15 0008  WBC 10.6*  HGB 14.2  HCT 43.2  MCV 88.7  PLT 277    Cardiac Enzymes: No results for input(s): CKTOTAL, CKMB, CKMBINDEX, TROPONINI in the last 168 hours.  Lipid Panel:  Recent Labs Lab 07/21/15 0008  CHOL 174  TRIG 66  HDL 37*  CHOLHDL 4.7  VLDL 13  LDLCALC 161*    CBG: No results for input(s): GLUCAP in the last 168 hours.  Microbiology: No results found for this or any previous visit.  Coagulation Studies: No results for input(s): LABPROT, INR in the last 72 hours.  Imaging: Mr [name] Head/brain Wo Cm  07/21/2015  CLINICAL DATA:  Initial evaluation for acute dizziness. EXAM: MRA HEAD WITHOUT CONTRAST TECHNIQUE: Angiographic images of the Circle of Willis were  obtained using MRA technique without intravenous contrast. COMPARISON:  None. FINDINGS: ANTERIOR CIRCULATION: Visualized distal cervical segments of the internal carotid arteries are widely patent with antegrade flow. Petrous, cavernous, and supraclinoid segments are widely patent bilaterally without flow-limiting stenosis. ICA termini widely patent. A1 segments patent bilaterally. Anterior communicating artery normal. Anterior cerebral arteries well opacified to their distal aspects and normal in appearance. M1 segments widely patent without stenosis or occlusion. MCA bifurcations normal. Probable short-segment atheromatous stenosis within a proximal M2 branch noted on the left (series 303, image 12). Distal MCA branches well opacified and symmetric. Minimal distal small vessel atheromatous irregularity noted within the MCA branches bilaterally. POSTERIOR CIRCULATION: Vertebral arteries widely patent to the vertebrobasilar junction. Vertebral arteries are largely code dominant. Partially visualized posterior inferior cerebral arteries patent bilaterally. Basilar artery widely patent. Superior cerebellar arteries well opacified bilaterally. Probable short-segment stenosis within the proximal left SCA. Both of the posterior cerebral arteries arise from the basilar artery are well opacified to their distal aspects. Possible tiny short-segment mild stenosis within the proximal left P2 segment. No aneurysm or vascular malformation. IMPRESSION: Normal intracranial MRA. No large or proximal arterial branch occlusion. No high-grade or correctable stenosis. Widely patent vertebrobasilar system with minimal atheromatous disease for patient age. Electronically Signed   By: Rise Mu M.D.   On: 07/21/2015 03:23        Assessment/Plan:  65 y.o. male with hx of COPD and smoking presented to the ER at [facility] with right upper and lower body numbness and right facial numbness. Patient's symptoms  started after waking up in the morning lasted 4 hours till 10:30 AM.  Patient's symptoms completely resolved. MRI brain done at [facility] shows  diffusion hyperintensity in the posterior limb left internal capsule presumably acute vs subacute infarct. Patient was transferred to [facility] for further workup    1. HgbA1c, fasting lipid panel 2. MRI, MRA  of the brain without contrast already done at [place]  3. PT consult, OT consult, Speech consult 4. Echocardiogram 5. Carotid dopplers 6. Prophylactic therapy-Antiplatelet med: Aspirin - dose  7. Risk factor modification 8. Telemetry monitoring 9. Frequent neuro checks 10 NPO until passes stroke swallow screen 11 please page stroke NP  Or  PA  Or MD from 8am -4 pm  as this patient from this time will be  followed by the stroke.   You can look them up on [email or web address]  Password [email or web address]

## 2015-07-22 LAB — HEMOGLOBIN A1C
Hgb A1c MFr Bld: 6.4 % — ABNORMAL HIGH (ref 4.8–5.6)
Mean Plasma Glucose: 137 mg/dL

## 2015-09-25 ENCOUNTER — Ambulatory Visit: Payer: BLUE CROSS/BLUE SHIELD | Admitting: Nurse Practitioner

## 2016-12-20 IMAGING — MR MR MRA HEAD W/O CM
1 series · 19 of 48 positions shown · non-contrast
Comparison: None.

CLINICAL DATA: Initial evaluation for acute dizziness.

EXAM:
MRA HEAD WITHOUT CONTRAST
TECHNIQUE: Angiographic images of the Circle of Willis were obtained using MRA
technique without intravenous contrast.

[Series 3: (id) mt fs · axial · 1.4mm · 0.43mm/px · z∈[-72,+18]mm · 19 of 136 slices shown]
[im 1/136]
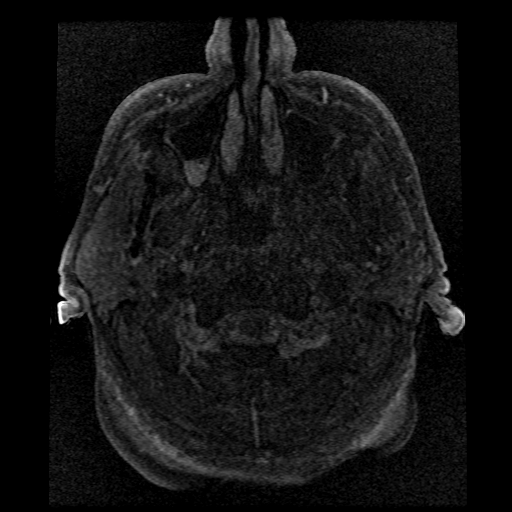
[im 3/136]
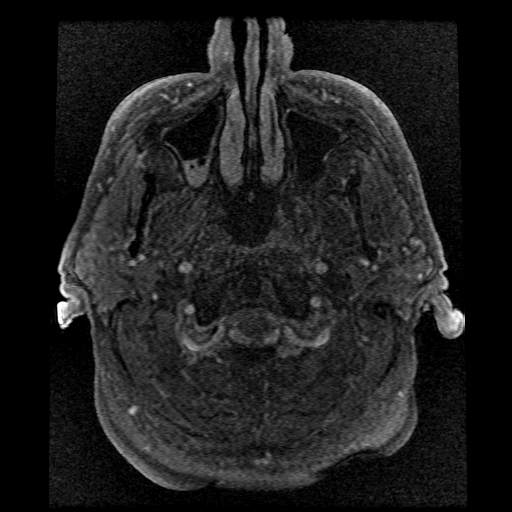
[im 6/136]
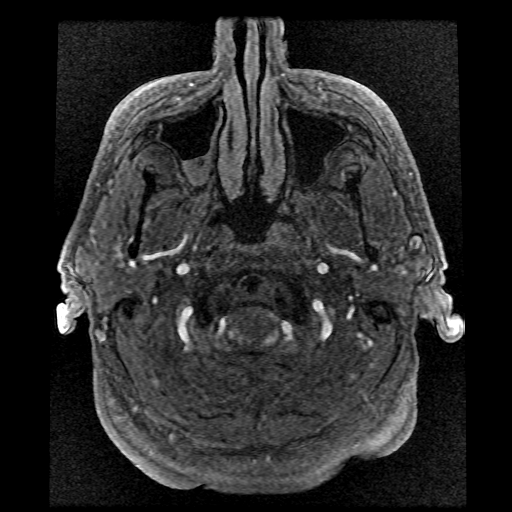
[im 9/136]
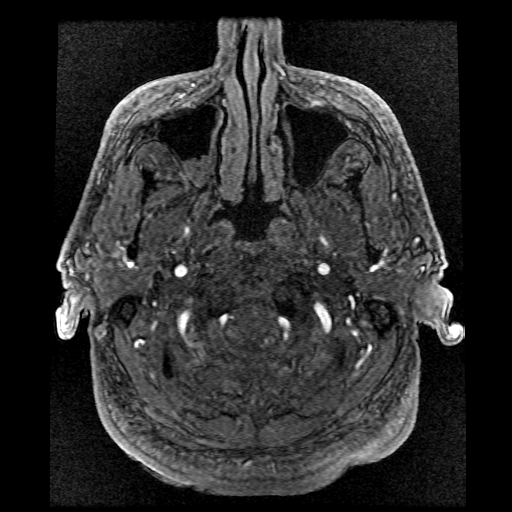
[im 12/136]
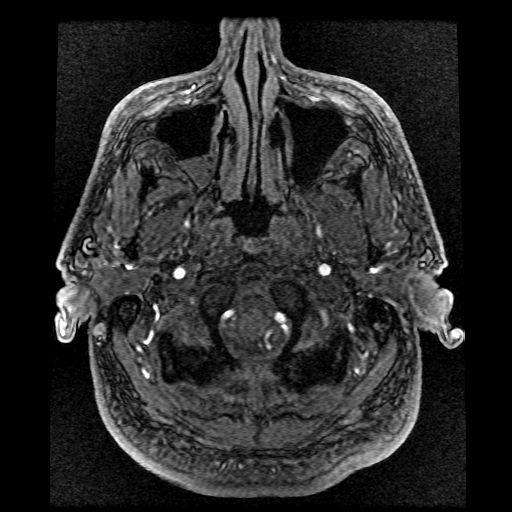
[im 15/136]
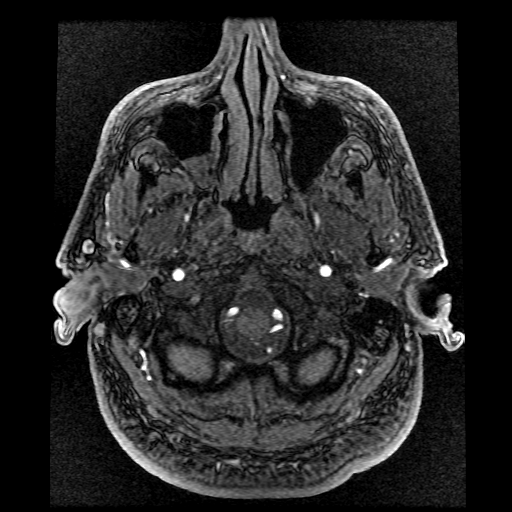
[im 18/136]
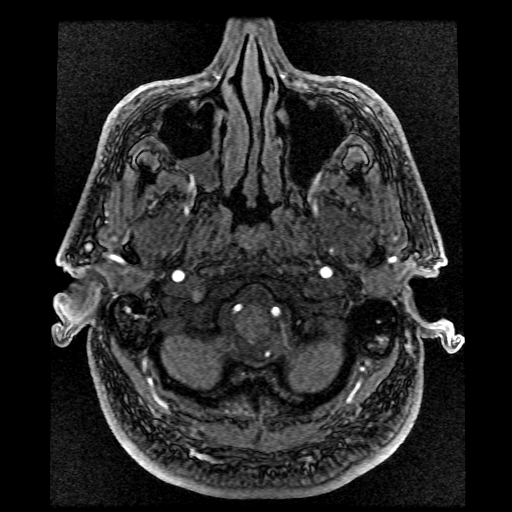
[im 21/136]
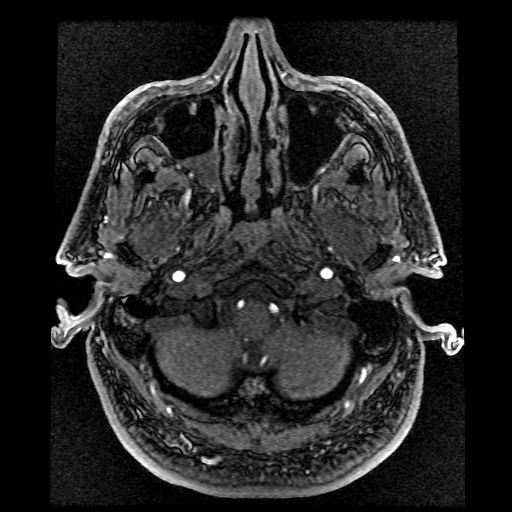
[im 23/136]
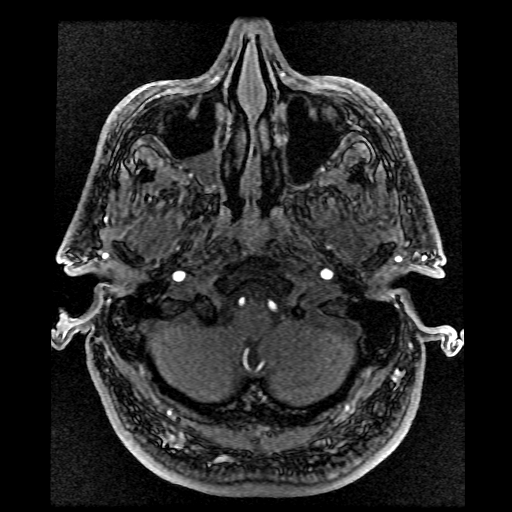
[im 26/136]
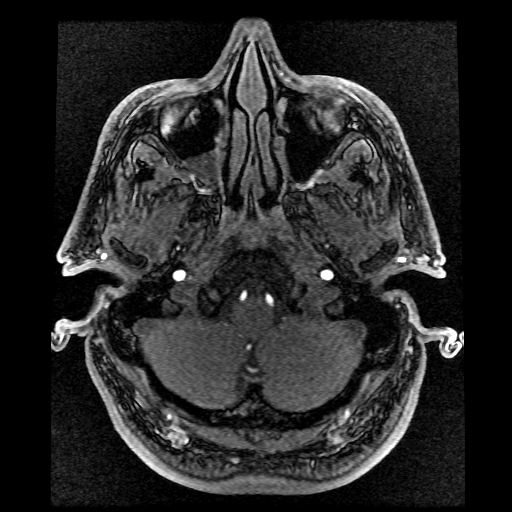
[im 29/136]
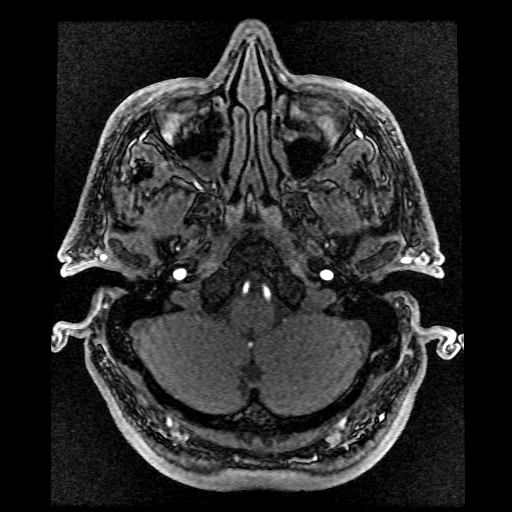
[im 44/136]
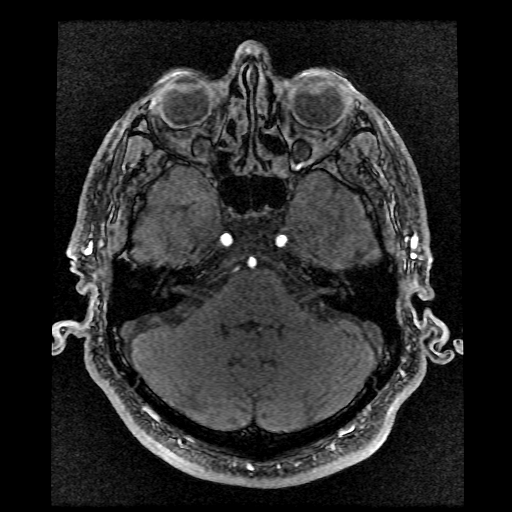
[im 61/136]
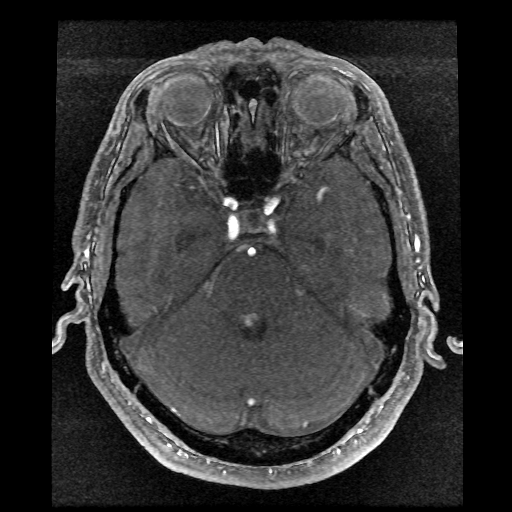
[im 69/136]
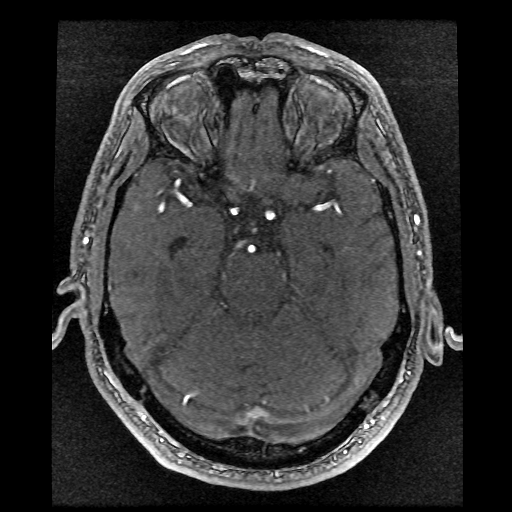
[im 78/136]
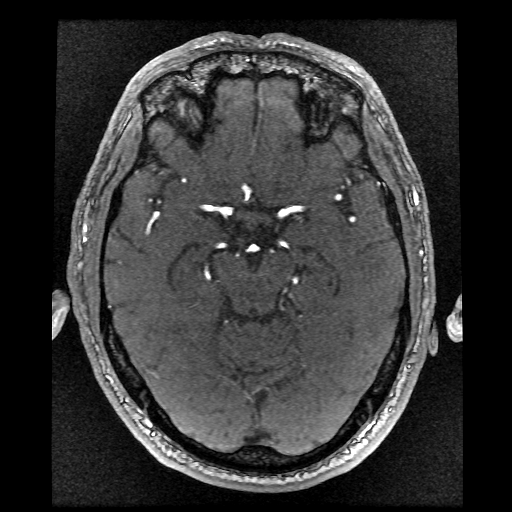
[im 95/136]
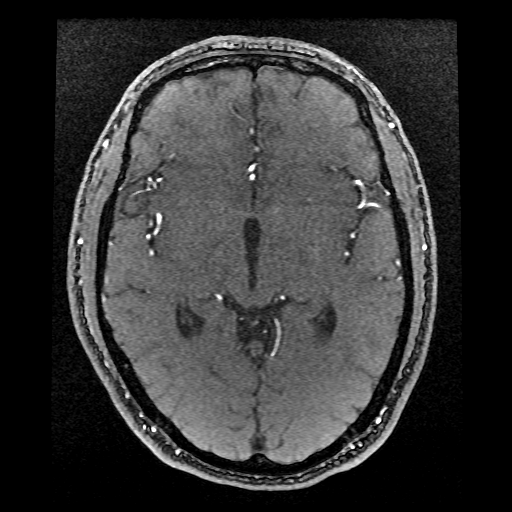
[im 113/136]
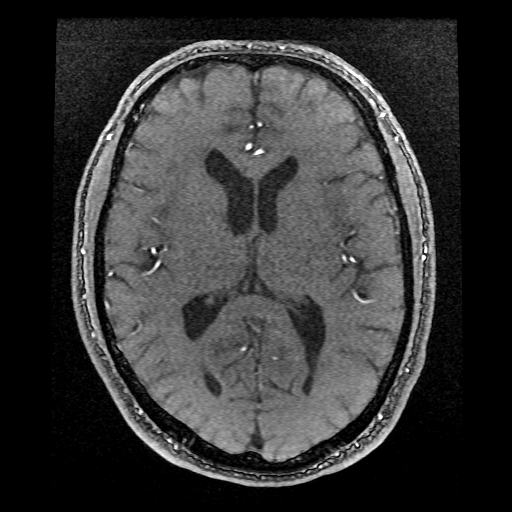
[im 115/136]
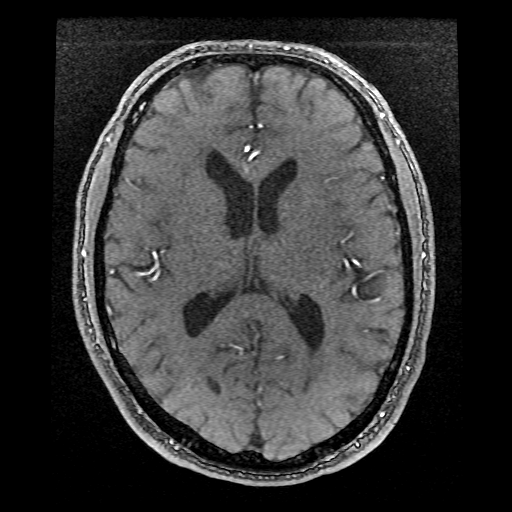
[im 130/136]
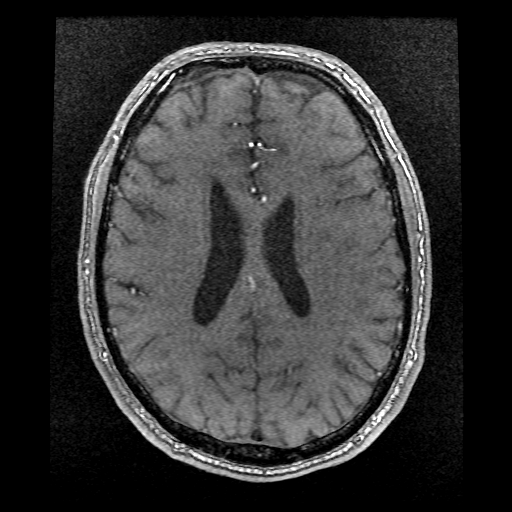

[19 of 48 positions shown; findings below may reference images not displayed]

FINDINGS: ANTERIOR CIRCULATION:

Visualized distal cervical segments of the internal carotid arteries
are widely patent with antegrade flow. Petrous, cavernous, and
supraclinoid segments are widely patent bilaterally without
flow-limiting stenosis. ICA termini widely patent. A1 segments
patent bilaterally. Anterior communicating artery normal. Anterior
cerebral arteries well opacified to their distal aspects and normal
in appearance.

M1 segments widely patent without stenosis or occlusion. MCA
bifurcations normal. Probable short-segment atheromatous stenosis
within a proximal M2 branch noted on the left (series 303, image
12). Distal MCA branches well opacified and symmetric. Minimal
distal small vessel atheromatous irregularity noted within the MCA
branches bilaterally.

POSTERIOR CIRCULATION:

Vertebral arteries widely patent to the vertebrobasilar junction.
Vertebral arteries are largely code dominant. Partially visualized
posterior inferior cerebral arteries patent bilaterally. Basilar
artery widely patent. Superior cerebellar arteries well opacified
bilaterally. Probable short-segment stenosis within the proximal
left SCA. Both of the posterior cerebral arteries arise from the
basilar artery are well opacified to their distal aspects. Possible
tiny short-segment mild stenosis within the proximal left P2
segment.

No aneurysm or vascular malformation.
IMPRESSION: Normal intracranial MRA. No large or proximal arterial branch
occlusion. No high-grade or correctable stenosis. Widely patent
vertebrobasilar system with minimal atheromatous disease for patient
age.

## 2019-08-25 DIAGNOSIS — Z01818 Encounter for other preprocedural examination: Secondary | ICD-10-CM | POA: Diagnosis not present
